# Patient Record
Sex: Female | Born: 1990 | Race: Black or African American | Hispanic: No | Marital: Single | Smoking: Never smoker
Health system: Southern US, Community
[De-identification: ages and names within clinical notes are randomized; demographics above are authoritative.]

## PROBLEM LIST (undated history)

## (undated) HISTORY — PX: MOUTH SURGERY: SHX715

---

## 2009-08-18 ENCOUNTER — Emergency Department (HOSPITAL_COMMUNITY): Admission: EM | Admit: 2009-08-18 | Discharge: 2009-08-18 | Payer: Self-pay | Admitting: Emergency Medicine

## 2009-09-04 ENCOUNTER — Emergency Department (HOSPITAL_COMMUNITY): Admission: EM | Admit: 2009-09-04 | Discharge: 2009-09-04 | Payer: Self-pay | Admitting: Emergency Medicine

## 2010-04-05 LAB — URINALYSIS, ROUTINE W REFLEX MICROSCOPIC
Glucose, UA: NEGATIVE mg/dL
Ketones, ur: NEGATIVE mg/dL
Leukocytes, UA: NEGATIVE
Specific Gravity, Urine: 1.022 (ref 1.005–1.030)
pH: 7 (ref 5.0–8.0)

## 2010-04-05 LAB — URINE MICROSCOPIC-ADD ON

## 2010-04-06 LAB — URINE CULTURE
Colony Count: NO GROWTH
Culture: NO GROWTH

## 2010-04-06 LAB — URINALYSIS, ROUTINE W REFLEX MICROSCOPIC
Bilirubin Urine: NEGATIVE
Glucose, UA: NEGATIVE mg/dL
Hgb urine dipstick: NEGATIVE
Ketones, ur: NEGATIVE mg/dL
Protein, ur: NEGATIVE mg/dL
Urobilinogen, UA: 1 mg/dL (ref 0.0–1.0)

## 2010-04-06 LAB — URINE MICROSCOPIC-ADD ON

## 2010-04-06 LAB — WET PREP, GENITAL

## 2010-04-06 LAB — POCT PREGNANCY, URINE: Preg Test, Ur: NEGATIVE

## 2011-03-30 ENCOUNTER — Emergency Department (INDEPENDENT_AMBULATORY_CARE_PROVIDER_SITE_OTHER)
Admission: EM | Admit: 2011-03-30 | Discharge: 2011-03-30 | Disposition: A | Discharge status: Home / Self Care | Payer: BC Managed Care – PPO | Source: Home / Self Care | Attending: Family Medicine | Admitting: Family Medicine

## 2011-03-30 ENCOUNTER — Encounter (HOSPITAL_COMMUNITY): Payer: Self-pay | Admitting: *Deleted

## 2011-03-30 DIAGNOSIS — K219 Gastro-esophageal reflux disease without esophagitis: Secondary | ICD-10-CM

## 2011-03-30 DIAGNOSIS — F411 Generalized anxiety disorder: Secondary | ICD-10-CM

## 2011-03-30 MED ORDER — GI COCKTAIL ~~LOC~~
30.0000 mL | Freq: Once | ORAL | Status: AC
  Administered 2011-03-30: 30 mL via ORAL

## 2011-03-30 MED ORDER — GI COCKTAIL ~~LOC~~
ORAL | Status: AC
  Filled 2011-03-30: qty 30

## 2011-03-30 MED ORDER — RANITIDINE HCL 150 MG PO TABS: 150.0000 mg | ORAL_TABLET | Freq: Two times a day (BID) | ORAL | Status: DC
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
## 2011-03-30 NOTE — ED Provider Notes (Signed)
History     CSN: [id number]  Arrival date & time 03/30/11  1103   First MD Initiated Contact with Patient 03/30/11 1105      Chief Complaint  Patient presents with  . Chest Pain    (Consider location/radiation/quality/duration/timing/severity/associated sxs/prior treatment) Patient is a 21 y.o. female presenting with chest pain. The history is provided by the patient.  Chest Pain The chest pain began yesterday. Chest pain occurs intermittently. The pain is associated with stress (crying, not sleeping.). The severity of the pain is mild. The quality of the pain is described as burning. The pain does not radiate.     History reviewed. No pertinent past medical history.  History reviewed. No pertinent past surgical history.  Family History  Problem Relation Age of Onset  . Hypertension Mother     History  Substance Use Topics  . Smoking status: Never Smoker   . Smokeless tobacco: Not on file  . Alcohol Use: No    OB History    Grav Para Term Preterm Abortions TAB SAB Ect Mult Living                  Review of Systems  Constitutional: Negative.   HENT: Negative.   Respiratory: Negative.   Cardiovascular: Positive for chest pain.  Psychiatric/Behavioral: Positive for dysphoric mood.    Allergies  Review of patient's allergies indicates no known allergies.  Home Medications  No current outpatient prescriptions on file.  BP 119/74  Pulse 94  Temp(Src) 98.7 F (37.1 C) (Oral)  Resp 14  SpO2 100%  LMP 03/08/2011  Physical Exam  Nursing note and vitals reviewed. Constitutional: She is oriented to person, place, and time. She appears well-developed and well-nourished.  HENT:  Head: Normocephalic.  Eyes: Pupils are equal, round, and reactive to light.  Neck: Normal range of motion. Neck supple.  Cardiovascular: Normal rate, normal heart sounds and intact distal pulses.   Pulmonary/Chest: Effort normal and breath sounds normal. She exhibits tenderness.    Abdominal: Soft. Bowel sounds are normal.  Lymphadenopathy:    She has no cervical adenopathy.  Neurological: She is alert and oriented to person, place, and time.  Skin: Skin is warm and dry.    ED Course  Procedures (including critical care time)  Labs Reviewed - No data to display No results found.   No diagnosis found.    MDM          Linna Hoff, MD 03/30/11 [phone]

## 2011-03-30 NOTE — Discharge Instructions (Signed)
Contact mental health center or your doctor if further problems.Go to www.goodrx.com to look up your medications. This will give you a list of where you can find your prescriptions at the most affordable prices.   RESOURCE GUIDE  Chronic Pain Problems Contact [facility]  [phone] Patients need to be referred by their primary care doctor.  Insufficient Money for Medicine Contact United Way:  call "211" or [facility] 212-828-5091.  No Primary Care Doctor Call [facility]  [phone] Other agencies that provide inexpensive medical care    [facility]  [phone]    [facility]  [phone]    [facility]  [phone]    [facility]  [phone] [address] [place] [address]    [facility]  [phone]    [facility]  [phone] [facility] [phone]   [address] [place] [place], [place] [address]  Psychological Services [facility]  [phone] [facility]  [phone] [facility] Mental Health   [phone] (emergency services [phone])  Substance Abuse Resources Alcohol and Drug Services  [phone] [facility] [phone] [facility] [place] [phone] Floydene Flock [phone] Residential & Outpatient Substance Abuse Program  [phone]  Abuse/Neglect [facility] Child Abuse Hotline (775)427-5078 [facility] Child Abuse Hotline 805-870-5185 (After Hours)  Emergency Shelter [facility] [phone]  Maternity Homes [facility] [place] [facility] ([phone] [facility] [phone]  MRSA Hotline #:   709-785-8931                                                Cristobal Goldmann Phone:  [phone]                                   Phone:  [phone]                 Phone:  [phone]  [facility] Mental Health Phone:   (934)645-2826  [facility] Child Abuse Hotline [phone]    [facility] Resources  [facility] [place]     United Way                          [facility] Dept. [address] [place]                       [address]      [address] [place] [address]   [phone] (After Hours)

## 2011-06-24 ENCOUNTER — Encounter (HOSPITAL_COMMUNITY): Payer: Self-pay

## 2011-06-24 ENCOUNTER — Emergency Department (HOSPITAL_COMMUNITY)
Admission: EM | Admit: 2011-06-24 | Discharge: 2011-06-24 | Disposition: A | Discharge status: Home / Self Care | Payer: BC Managed Care – PPO | Attending: Emergency Medicine | Admitting: Emergency Medicine

## 2011-06-24 ENCOUNTER — Emergency Department (HOSPITAL_COMMUNITY): Payer: BC Managed Care – PPO

## 2011-06-24 DIAGNOSIS — S92403A Displaced unspecified fracture of unspecified great toe, initial encounter for closed fracture: Secondary | ICD-10-CM

## 2011-06-24 DIAGNOSIS — W208XXA Other cause of strike by thrown, projected or falling object, initial encounter: Secondary | ICD-10-CM | POA: Insufficient documentation

## 2011-06-24 DIAGNOSIS — S92919A Unspecified fracture of unspecified toe(s), initial encounter for closed fracture: Secondary | ICD-10-CM | POA: Insufficient documentation

## 2011-06-24 MED ORDER — HYDROCODONE-ACETAMINOPHEN 5-325 MG PO TABS: 2.0000 | ORAL_TABLET | ORAL | Status: AC | PRN

## 2011-06-24 NOTE — Discharge Instructions (Signed)
Do not drive or operate heavy machinery while taking pain medication   RESOURCE GUIDE  Chronic Pain Problems: Contact [facility]  [phone] Patients need to be referred by their primary care doctor.  Insufficient Money for Medicine: Contact United Way:  call "211" or [facility] [phone].  No Primary Care Doctor: - Call [facility]  [phone] - can help you locate a primary care doctor that  accepts your insurance, provides certain services, etc. - Physician Referral Service- [phone]  Agencies that provide inexpensive medical care: - [facility]  [phone] - [facility]  [phone] - [facility]  [phone] - Women's Clinic  [phone] - [facility]  [phone] [facility]  [phone]  Medicaid-accepting [employer] Providers: - [facility]- [address]  [phone], Mon-Fri 9am-7pm, Sat 9am-1pm - [facility]- [address] [place], [address] [place]  [phone] - [facility]- [address], [address] [place]  [phone] [facility]- [address]  [phone] - Renaye Rakers- [address] [place], [address], [phone]  Only accepts [place] patients after they have their name  applied to their card  Self Pay (no insurance) in [place]: - Sickle Cell Patients: Dr Willey Blade, [facility]  [address] [place], [phone] - [facility]- [address] [place]  [phone]       [facility] [place]- [address]       -     [facility]- see information above (Speak to [employer] if you do not have insurance)       -  [facility]- [address] [place], [phone]       -  [facility]- [address],  [phone]       -  [facility]- [address] [phone]       -  Dr Julio Sicks-  [address]  Dr, [address], [place], [phone]       -  [facility] Urgent Care- [address], [phone]       -  [facility]- [address], [phone], also [address] [phone]       -    [facility]- [address] [place], [phone], 1st & 3rd Saturday   every month, 10am-1pm  1) Find a Doctor and Pay Out of Pocket Although you won't have to find out who is covered by your insurance plan, it is a good idea to ask around and get recommendations. You will then need to call the office and see if the doctor you have chosen will accept you as a new patient and what types of options they offer for patients who are self-pay. Some doctors offer discounts or will set up payment plans for their patients who do not have insurance, but you will need to ask so you aren't surprised when you get to your appointment.  2) Contact Your Local Health Department Not all health departments have doctors that can see patients for sick visits, but many do, so it is worth a call to see if yours does. If you don't know where your local health department is, you can check in your phone book. The CDC also has a tool to  help you locate your state's health department, and many state websites also have listings of all of their local health departments.  3) Find a Walk-in Clinic If your illness is not likely to be very severe or complicated, you may want to try a walk in clinic. These are popping up all over the country in pharmacies, drugstores, and shopping centers. They're usually staffed by nurse practitioners or physician assistants that have been trained to treat common illnesses and complaints. They're usually fairly quick and inexpensive. However, if you have serious medical issues or chronic medical problems, these are probably not your best option  STD Testing - [facility] Department of [facility] [place], STD Clinic, [address], [place], phone [phone] or [phone].   Monday - Friday, call for an appointment. [facility] Department of [employer], STD Clinic, [address], [place], phone [phone] or [phone].  Monday - Friday, call for an appointment.  Abuse/Neglect: [facility] Child Abuse Hotline 210-683-2375 [facility] Child Abuse Hotline (437)837-1521 (After Hours)  Emergency Shelter:  [facility] [phone]  Maternity Homes: - [facility] at the [place] [facility] [phone] - [facility] (579) [phone]  MRSA Hotline #:   (510)365-6992  [facility] Resources  [facility] [place]  United Way [facility] Dept. [address]                 [address]         [address] [place] [address]  Blondell Reveal Phone:  [phone]                                  Phone:  [phone]                   Phone:  [phone]  [facility] Mental Health, [phone] - [facility] - [phone]       -     [facility] in [place], [address],                                  6621219382, [facility] Child Abuse Hotline 863-143-2886 or (856)241-7842 (After Hours)   Behavioral Health Services  Substance Abuse Resources: - Alcohol and Drug Services  [phone] - [facility] (954)[phone] - [facility] [place] [phone])[phone] Floydene Flock ([phone]) [phone] - Residential & Outpatient Substance Abuse Program  (581)[phone]  Psychological Services: [facility]  [phone] Services  [phone] - [facility], [phone] [place]. [address] [place], ACCESS LINE: (413)095-1373 or [phone], [email or web address]  Dental Assistance  If unable to pay or uninsured, contact:  [facility] or [facility] to become qualified for the adult dental clinic.  Patients with Medicaid: [facility]  [address], [phone] [address], [phone]  If unable to pay, or uninsured, contact [facility] [phone]) or [facility] Department [phone] in [place], [phone] in [facility]) to become qualified for the adult dental clinic  Other Low-Cost Community Dental Services: - [facility]- [address] [place], [place], [place], [address], [phone], [address]. [phone], 2nd and 4th Thursday of the month at 6:30am.  10 clients each day by appointment, can sometimes see walk-in patients if someone does not show for an appointment. [facility]- [address] [place], [place], [address], [phone] - [facility]- [address] [place], [place], [address], [phone] - [facility]- [phone] [facility] Health Department- [phone] [facility] Department- [phone]
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
## 2011-06-24 NOTE — ED Provider Notes (Signed)
History     CSN: [id number]  Arrival date & time 06/24/11  1641   First MD Initiated Contact with Patient 06/24/11 1738      Chief Complaint  Patient presents with  . Foot Pain    LT side    (Consider location/radiation/quality/duration/timing/severity/associated sxs/prior treatment) HPI Comments: Patient reports that just prior to arrival she dropped a heavy pot on her left great toe.  She has been having pain since that time.  She has not taken anything for pain.  Patient is a 21 y.o. female presenting with lower extremity pain. The history is provided by the patient.  Foot Pain This is a new problem. The current episode started today. The problem occurs constantly. The problem has been unchanged. Pertinent negatives include no chills, fever, nausea, numbness or vomiting. The symptoms are aggravated by walking. She has tried nothing for the symptoms.    No past medical history on file.  Past Surgical History  Procedure Date  . Mouth surgery     teeth extraction.    Family History  Problem Relation Age of Onset  . Hypertension Mother     History  Substance Use Topics  . Smoking status: Never Smoker   . Smokeless tobacco: Not on file  . Alcohol Use: Yes     occasionally     OB History    Grav Para Term Preterm Abortions TAB SAB Ect Mult Living                  Review of Systems  Constitutional: Negative for fever and chills.  Gastrointestinal: Negative for nausea and vomiting.  Musculoskeletal:       Pain with ambulation  Skin: Negative for color change and wound.  Neurological: Negative for numbness.    Allergies  Review of patient's allergies indicates no known allergies.  Home Medications  No current outpatient prescriptions on file.  BP 119/73  Pulse 99  Temp(Src) 98.9 F (37.2 C) (Oral)  Resp 16  Ht 5\' 9"  (1.753 m)  Wt 125 lb (56.7 kg)  BMI 18.46 kg/m2  SpO2 100%  LMP 06/07/2011  Physical Exam  Nursing note and vitals  reviewed. Constitutional: She appears well-developed and well-nourished. No distress.  HENT:  Head: Normocephalic and atraumatic.  Cardiovascular: Normal rate, regular rhythm and normal heart sounds.   Pulses:      Dorsalis pedis pulses are 2+ on the right side, and 2+ on the left side.  Pulmonary/Chest: Effort normal and breath sounds normal.  Musculoskeletal:       Left ankle: She exhibits normal range of motion, no swelling and no deformity. no tenderness.       Mild swelling of the dorsal aspect of the left great toe.  Pain with ROM of left great toe.  Neurological: No sensory deficit.  Skin: Skin is warm, dry and intact. No bruising, no ecchymosis and no laceration noted. She is not diaphoretic. No erythema.  Psychiatric: She has a normal mood and affect.    ED Course  Procedures (including critical care time)  Labs Reviewed - No data to display Dg Foot Complete Left  06/24/2011  *RADIOLOGY REPORT*  Clinical Data: Trauma and pain across the toes.  LEFT FOOT - COMPLETE 3+ VIEW  Comparison: None.  Findings: Three views of the left foot demonstrate a minimally displaced spiral fracture of the great toe proximal phalanx.  The fracture does not clearly involve the articulating surfaces.  No other definite fractures.  IMPRESSION: Minimally displaced  fracture of the great toe proximal phalanx.  Original Report Authenticated By: Richarda Overlie, M.D.     No diagnosis found.    MDM  Patient with fracture of great toe.  Neurovascularly intact.  Patient given Orthopedic boot and toe buddy taped.          Pascal [place], PA-C 06/25/11 0157

## 2011-06-25 NOTE — ED Provider Notes (Signed)
Medical screening examination/treatment/procedure(s) were performed by non-physician practitioner and as supervising physician I was immediately available for consultation/collaboration.    Marishka Rentfrow R Tanav Orsak, MD 06/25/11 1103 

## 2012-06-21 ENCOUNTER — Emergency Department (INDEPENDENT_AMBULATORY_CARE_PROVIDER_SITE_OTHER)
Admission: EM | Admit: 2012-06-21 | Discharge: 2012-06-21 | Disposition: A | Discharge status: Home / Self Care | Payer: BC Managed Care – PPO | Source: Home / Self Care | Attending: Family Medicine | Admitting: Family Medicine

## 2012-06-21 ENCOUNTER — Encounter (HOSPITAL_COMMUNITY): Payer: Self-pay | Admitting: *Deleted

## 2012-06-21 DIAGNOSIS — S0510XA Contusion of eyeball and orbital tissues, unspecified eye, initial encounter: Secondary | ICD-10-CM

## 2012-06-21 DIAGNOSIS — S0511XA Contusion of eyeball and orbital tissues, right eye, initial encounter: Secondary | ICD-10-CM
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
## 2013-12-30 IMAGING — CR DG FOOT COMPLETE 3+V*L*
3 series · 3 of 3 positions shown · non-contrast
Comparison: None.

CLINICAL DATA: Trauma and pain across the toes.

LEFT FOOT - COMPLETE 3+ VIEW

[x foot ap left]
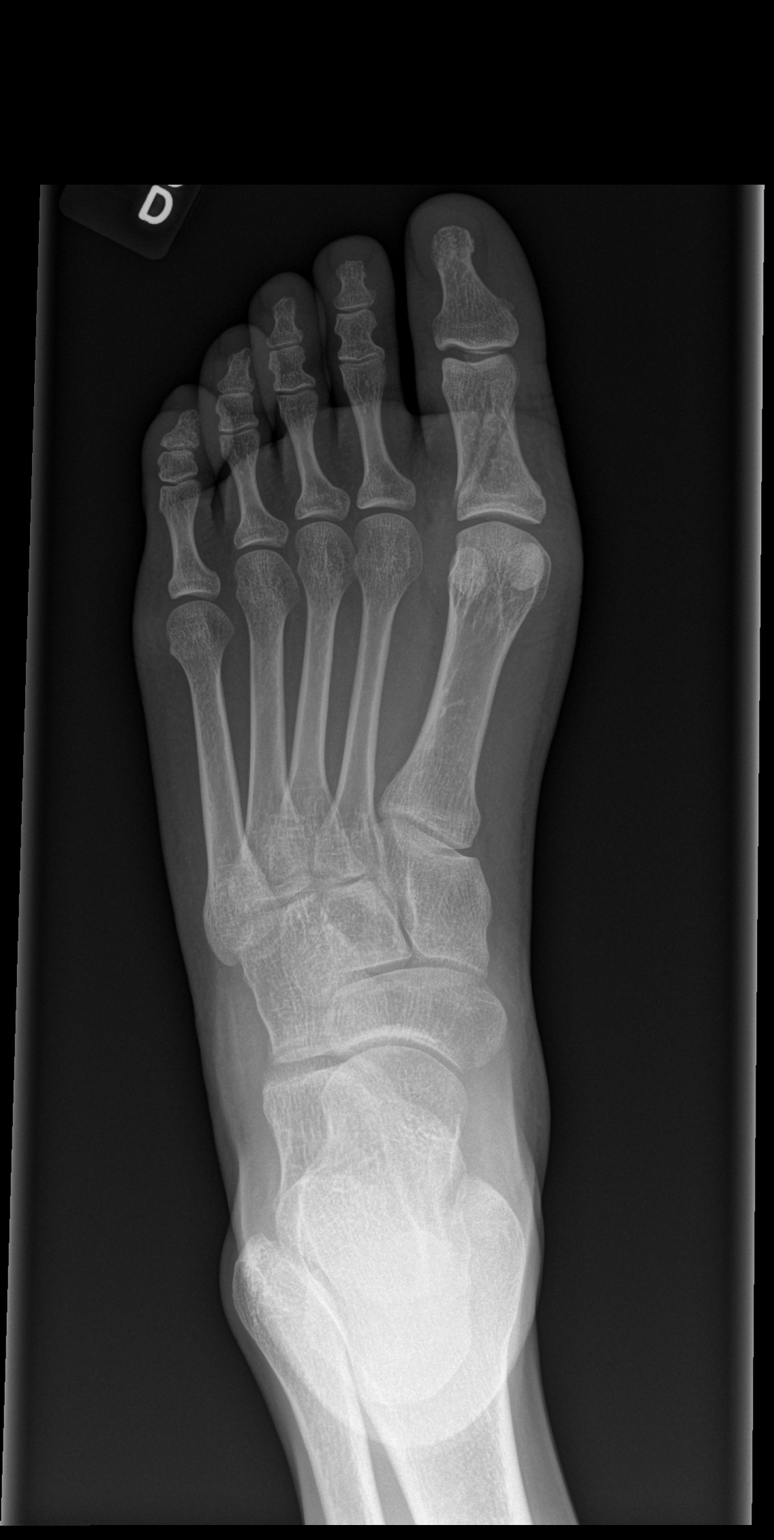

[x foot obl left]
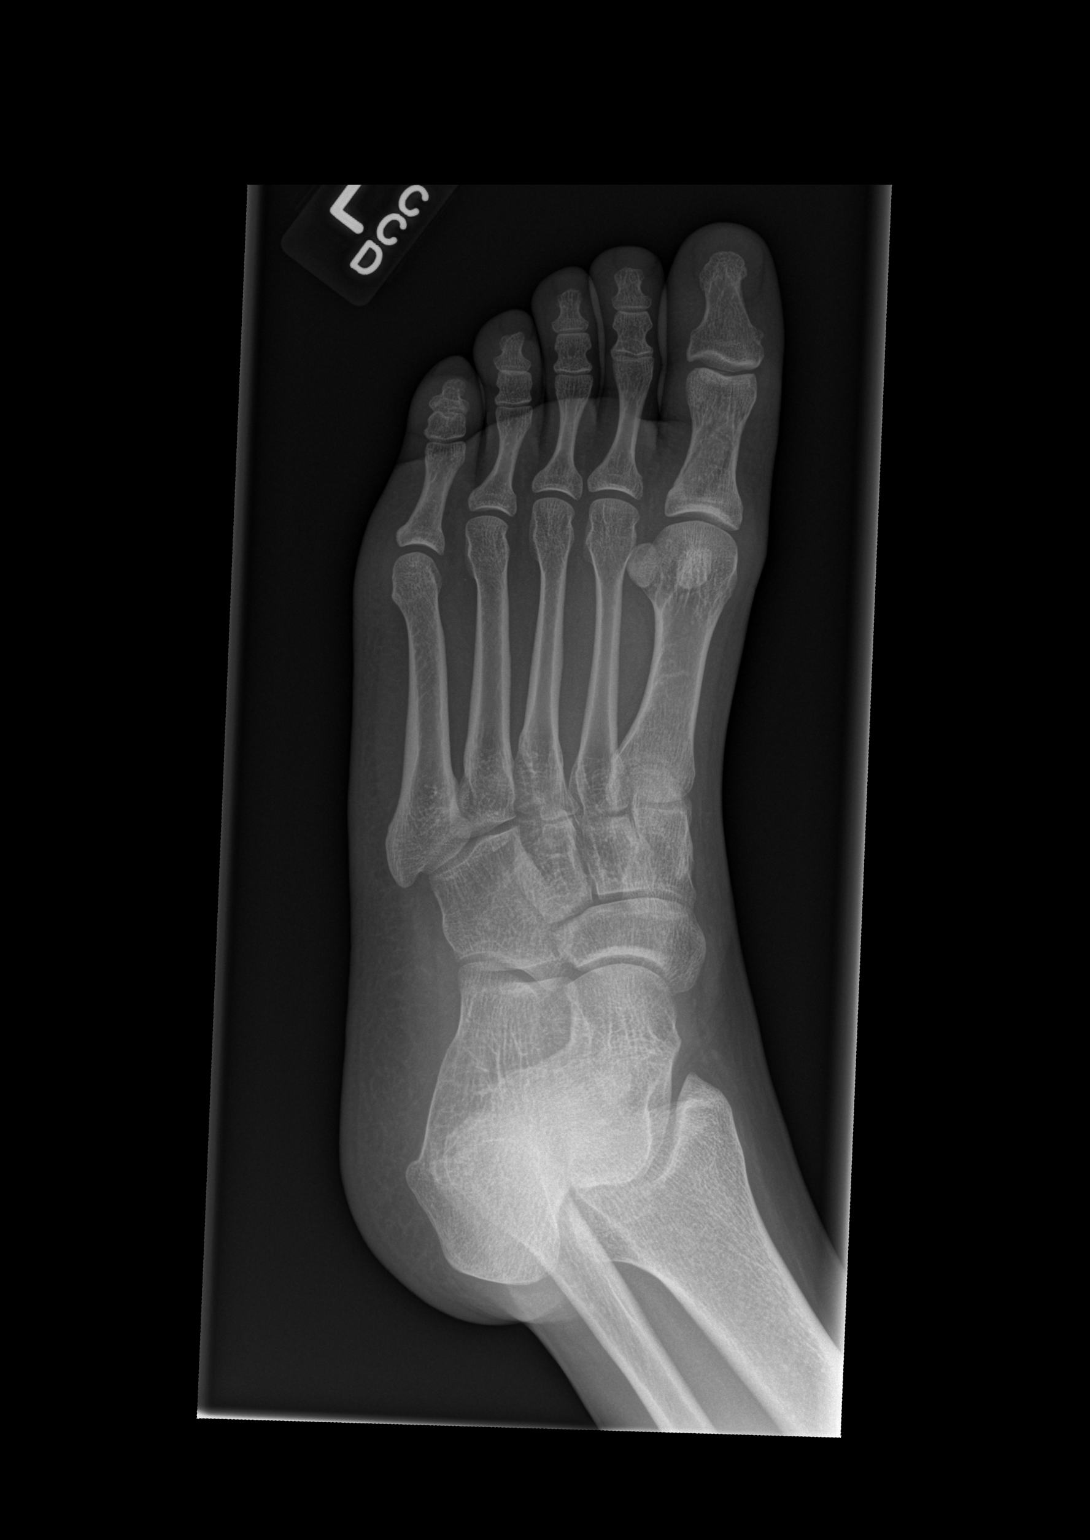

[x foot lat left]
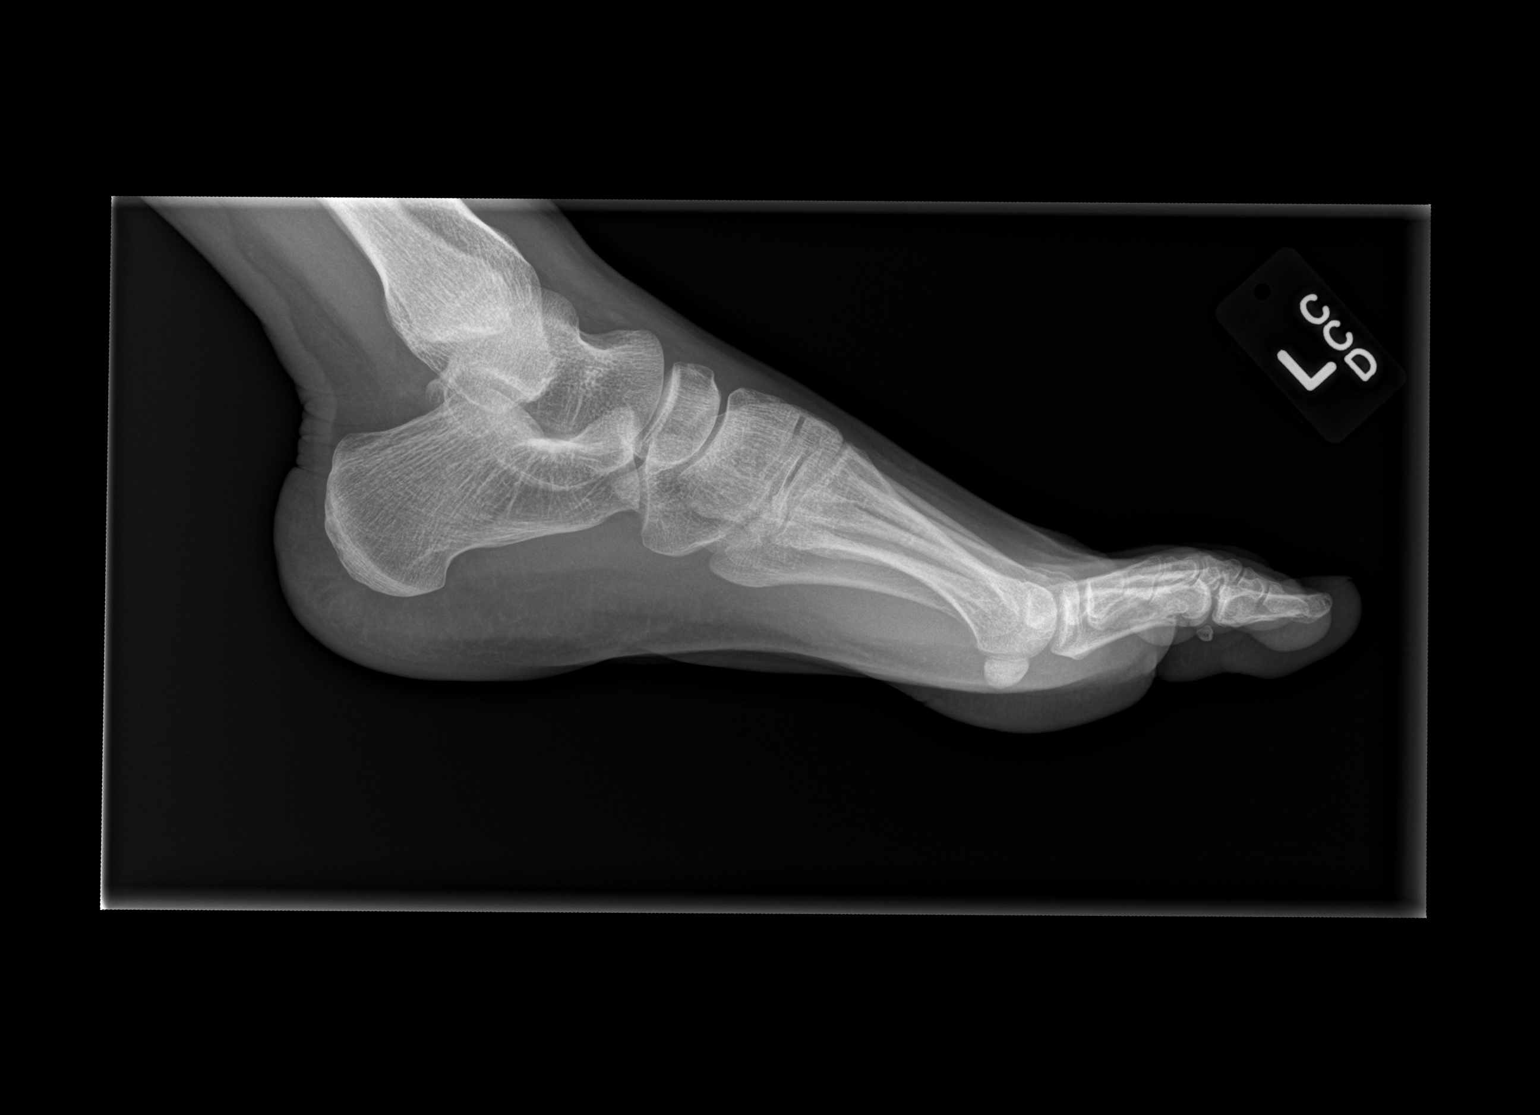

[3 of 3 positions shown; findings below may reference images not displayed]

FINDINGS: Three views of the left foot demonstrate a minimally
displaced spiral fracture of the great toe proximal phalanx.  The
fracture does not clearly involve the articulating surfaces.  No
other definite fractures.
IMPRESSION: Minimally displaced fracture of the great toe proximal phalanx.

## 2015-07-11 ENCOUNTER — Ambulatory Visit (HOSPITAL_COMMUNITY)
Admission: EM | Admit: 2015-07-11 | Discharge: 2015-07-11 | Disposition: A | Discharge status: Home / Self Care | Payer: Managed Care, Other (non HMO) | Attending: Emergency Medicine | Admitting: Emergency Medicine

## 2015-07-11 ENCOUNTER — Encounter (HOSPITAL_COMMUNITY): Payer: Self-pay

## 2015-07-11 DIAGNOSIS — O9989 Other specified diseases and conditions complicating pregnancy, childbirth and the puerperium: Secondary | ICD-10-CM

## 2015-07-11 DIAGNOSIS — R102 Pelvic and perineal pain: Secondary | ICD-10-CM | POA: Diagnosis present

## 2015-07-11 DIAGNOSIS — N760 Acute vaginitis: Secondary | ICD-10-CM | POA: Insufficient documentation

## 2015-07-11 DIAGNOSIS — S76211A Strain of adductor muscle, fascia and tendon of right thigh, initial encounter: Secondary | ICD-10-CM | POA: Diagnosis not present

## 2015-07-11 DIAGNOSIS — N898 Other specified noninflammatory disorders of vagina: Secondary | ICD-10-CM | POA: Diagnosis not present

## 2015-07-11 DIAGNOSIS — N730 Acute parametritis and pelvic cellulitis: Secondary | ICD-10-CM

## 2015-07-11 DIAGNOSIS — N739 Female pelvic inflammatory disease, unspecified: Secondary | ICD-10-CM | POA: Diagnosis not present

## 2015-07-11 DIAGNOSIS — X58XXXA Exposure to other specified factors, initial encounter: Secondary | ICD-10-CM | POA: Diagnosis not present

## 2015-07-11 DIAGNOSIS — O26899 Other specified pregnancy related conditions, unspecified trimester: Secondary | ICD-10-CM

## 2015-07-11 LAB — POCT URINALYSIS DIP (DEVICE)
BILIRUBIN URINE: NEGATIVE
Glucose, UA: NEGATIVE mg/dL
Hgb urine dipstick: NEGATIVE
Ketones, ur: NEGATIVE mg/dL
Leukocytes, UA: NEGATIVE
NITRITE: NEGATIVE
PH: 6 (ref 5.0–8.0)
PROTEIN: 30 mg/dL — AB
Specific Gravity, Urine: >=1.03 (ref 1.005–1.030)
UROBILINOGEN UA: 1 mg/dL (ref 0.0–1.0)

## 2015-07-11 LAB — POCT PREGNANCY, URINE: PREG TEST UR: NEGATIVE

## 2015-07-11 MED ORDER — LIDOCAINE HCL (PF) 1 % IJ SOLN
INTRAMUSCULAR | Status: AC
  Filled 2015-07-11: qty 5

## 2015-07-11 MED ORDER — METRONIDAZOLE 500 MG PO TABS: 500.0000 mg | ORAL_TABLET | Freq: Two times a day (BID) | ORAL | Status: DC

## 2015-07-11 MED ORDER — FLUCONAZOLE 150 MG PO TABS: ORAL_TABLET | ORAL | Status: DC

## 2015-07-11 MED ORDER — CEFTRIAXONE SODIUM 250 MG IJ SOLR
INTRAMUSCULAR | Status: AC
  Filled 2015-07-11: qty 250

## 2015-07-11 MED ORDER — AZITHROMYCIN 250 MG PO TABS
ORAL_TABLET | ORAL | Status: AC
  Filled 2015-07-11: qty 4

## 2015-07-11 MED ORDER — TRAMADOL HCL 50 MG PO TABS: 50.0000 mg | ORAL_TABLET | Freq: Four times a day (QID) | ORAL | Status: AC | PRN

## 2015-07-11 MED ORDER — CEFTRIAXONE SODIUM 250 MG IJ SOLR
250.0000 mg | Freq: Once | INTRAMUSCULAR | Status: AC
  Administered 2015-07-11: 250 mg via INTRAMUSCULAR

## 2015-07-11 MED ORDER — AZITHROMYCIN 250 MG PO TABS
1000.0000 mg | ORAL_TABLET | Freq: Once | ORAL | Status: AC
  Administered 2015-07-11: 1000 mg via ORAL

## 2015-07-11 NOTE — ED Provider Notes (Signed)
CSN: [id number]     Arrival date & time 07/11/15  1943 History   First MD Initiated Contact with Patient 07/11/15 1958     Chief Complaint  Patient presents with  . Pelvic Pain   (Consider location/radiation/quality/duration/timing/severity/associated sxs/prior Treatment) HPI Comments: 25 year old female complaining of right pelvic pain. She states it started approximately one week ago. She describes it as a cramping type pain. She also has a vaginal discharge but states she believes this is normal for her. Today she felt warm and hot and on arrival to the urgent care temperature was 100.4. Denies urinary symptoms.  Points to the proximal medial right thigh as a source of pain and tenderness. She exercises at the gym and this tends to make this worse. Better with rest.   History reviewed. No pertinent past medical history. Past Surgical History  Procedure Laterality Date  . Mouth surgery      teeth extraction.   Family History  Problem Relation Age of Onset  . Hypertension Mother    Social History  Substance Use Topics  . Smoking status: Never Smoker   . Smokeless tobacco: Never Used  . Alcohol Use: Yes     Comment: occasionally    OB History    No data available     Review of Systems  Constitutional: Positive for fever and activity change. Negative for fatigue.  HENT: Negative.   Respiratory: Negative.   Gastrointestinal: Negative.  Negative for nausea, vomiting and abdominal pain.  Genitourinary: Positive for pelvic pain. Negative for dysuria, flank pain, vaginal bleeding, genital sores and menstrual problem.  Musculoskeletal: Negative for back pain, joint swelling and neck pain.  Skin: Negative.  Negative for rash.  Neurological: Negative.   All other systems reviewed and are negative.   Allergies  Review of patient's allergies indicates no known allergies.  Home Medications   Prior to Admission medications   Medication Sig Start Date End Date Taking?  Authorizing Provider  fluconazole (DIFLUCAN) 150 MG tablet 1 tab po x 1. May repeat in 72 hours if no improvement 07/11/15   Hayden Rasmussenavid Brittainy Bucker, NP  metroNIDAZOLE (FLAGYL) 500 MG tablet Take 1 tablet (500 mg total) by mouth 2 (two) times daily. X 7 days 07/11/15   Hayden Rasmussenavid Xavius Spadafore, NP  traMADol (ULTRAM) 50 MG tablet Take 1 tablet (50 mg total) by mouth every 6 (six) hours as needed. 07/11/15   Hayden Rasmussenavid Dallin Mccorkel, NP   Meds Ordered and Administered this Visit   Medications  cefTRIAXone (ROCEPHIN) injection 250 mg (not administered)  azithromycin (ZITHROMAX) tablet 1,000 mg (not administered)    BP 107/71 mmHg  Pulse 109  Temp(Src) 100.4 F (38 C) (Oral)  SpO2 100%  LMP 06/24/2015 (Exact Date) No data found.   Physical Exam  Constitutional: She is oriented to person, place, and time. She appears well-developed and well-nourished. No distress.  Eyes: Conjunctivae and EOM are normal.  Neck: Normal range of motion. Neck supple.  Cardiovascular: Normal rate and regular rhythm.   Pulmonary/Chest: Effort normal.  Abdominal: Soft. Bowel sounds are normal. She exhibits no distension and no mass. There is no tenderness. There is no rebound and no guarding.  Genitourinary:  Normal external female genitalia. There is a moderate amount of thin gray discharge pouring from the vaginal orifice. Upon insertion of the speculum there is a large, copious amount of vaginal discharge. Portion of his thin gray type discharge, there is a thicker white discharge pulled in the vaginal vault and covering the cervix. The ectocervix  is mildly erythematous and there are white dotted specks adhering to the ectocervical surface. Positive CMT and bilateral adnexal tenderness.  Musculoskeletal: She exhibits no edema.  Palpation of the abdomen reveals no abdominal tenderness, rebound or guarding. Palpation of the pelvis reveals no tenderness. Palpation of the adductor muscle of the right leg reproduces the same pain for which patient presents.  Having the patient perform a straight leg raise and then after that the right leg and crossover the left leg reproduces the same pain to a mild degree.  Neurological: She is alert and oriented to person, place, and time. She exhibits normal muscle tone.  Skin: Skin is warm and dry.  Psychiatric: She has a normal mood and affect.  Nursing note and vitals reviewed.   ED Course  Procedures (including critical care time)  Labs Review Labs Reviewed  POCT URINALYSIS DIP (DEVICE) - Abnormal; Notable for the following:    Protein, ur 30 (*)    All other components within normal limits  POCT PREGNANCY, URINE  CERVICOVAGINAL ANCILLARY ONLY   Results for orders placed or performed during the hospital encounter of 07/11/15  POCT urinalysis dip (device)  Result Value Ref Range   Glucose, UA NEGATIVE NEGATIVE mg/dL   Bilirubin Urine NEGATIVE NEGATIVE   Ketones, ur NEGATIVE NEGATIVE mg/dL   Specific Gravity, Urine >=1.030 1.005 - 1.030   Hgb urine dipstick NEGATIVE NEGATIVE   pH 6.0 5.0 - 8.0   Protein, ur 30 (A) NEGATIVE mg/dL   Urobilinogen, UA 1.0 0.0 - 1.0 mg/dL   Nitrite NEGATIVE NEGATIVE   Leukocytes, UA NEGATIVE NEGATIVE  Pregnancy, urine POC  Result Value Ref Range   Preg Test, Ur NEGATIVE NEGATIVE     Imaging Review No results found.   Visual Acuity Review  Right Eye Distance:   Left Eye Distance:   Bilateral Distance:    Right Eye Near:   Left Eye Near:    Bilateral Near:         MDM   1. PID (acute pelvic inflammatory disease)   2. Vaginitis   3. Vaginal discharge   4. Pelvic pain affecting pregnancy   5. Strain of adductor muscle, fascia and tendon of right thigh, initial encounter    Meds ordered this encounter  Medications  . cefTRIAXone (ROCEPHIN) injection 250 mg    Sig:   . azithromycin (ZITHROMAX) tablet 1,000 mg    Sig:   . metroNIDAZOLE (FLAGYL) 500 MG tablet    Sig: Take 1 tablet (500 mg total) by mouth 2 (two) times daily. X 7 days     Dispense:  14 tablet    Refill:  0    Order Specific Question:  Supervising Provider    Answer:  Charm Rings [id number]  . fluconazole (DIFLUCAN) 150 MG tablet    Sig: 1 tab po x 1. May repeat in 72 hours if no improvement    Dispense:  2 tablet    Refill:  0    Order Specific Question:  Supervising Provider    Answer:  Charm Rings [id number]  . traMADol (ULTRAM) 50 MG tablet    Sig: Take 1 tablet (50 mg total) by mouth every 6 (six) hours as needed.    Dispense:  15 tablet    Refill:  0    Order Specific Question:  Supervising Provider    Answer:  Charm Rings [[id number]]   Ibuprofen, heat and stretches for adductor strain Written info on above dx's. Cervical cytology  pending.    Hayden Rasmussen, NP 07/11/15 2040

## 2015-07-11 NOTE — Discharge Instructions (Signed)
Bacterial Vaginosis Bacterial vaginosis is a vaginal infection that occurs when the normal balance of bacteria in the vagina is disrupted. It results from an overgrowth of certain bacteria. This is the most common vaginal infection in women of childbearing age. Treatment is important to prevent complications, especially in pregnant women, as it can cause a premature delivery. CAUSES  Bacterial vaginosis is caused by an increase in harmful bacteria that are normally present in smaller amounts in the vagina. Several different kinds of bacteria can cause bacterial vaginosis. However, the reason that the condition develops is not fully understood. RISK FACTORS Certain activities or behaviors can put you at an increased risk of developing bacterial vaginosis, including:  Having a new sex partner or multiple sex partners.  Douching.  Using an intrauterine device (IUD) for contraception. Women do not get bacterial vaginosis from toilet seats, bedding, swimming pools, or contact with objects around them. SIGNS AND SYMPTOMS  Some women with bacterial vaginosis have no signs or symptoms. Common symptoms include:  Grey vaginal discharge.  A fishlike odor with discharge, especially after sexual intercourse.  Itching or burning of the vagina and vulva.  Burning or pain with urination. DIAGNOSIS  Your health care provider will take a medical history and examine the vagina for signs of bacterial vaginosis. A sample of vaginal fluid may be taken. Your health care provider will look at this sample under a microscope to check for bacteria and abnormal cells. A vaginal pH test may also be done.  TREATMENT  Bacterial vaginosis may be treated with antibiotic medicines. These may be given in the form of a pill or a vaginal cream. A second round of antibiotics may be prescribed if the condition comes back after treatment. Because bacterial vaginosis increases your risk for sexually transmitted diseases, getting  treated can help reduce your risk for chlamydia, gonorrhea, HIV, and herpes. HOME CARE INSTRUCTIONS   Only take over-the-counter or prescription medicines as directed by your health care provider.  If antibiotic medicine was prescribed, take it as directed. Make sure you finish it even if you start to feel better.  Tell all sexual partners that you have a vaginal infection. They should see their health care provider and be treated if they have problems, such as a mild rash or itching.  During treatment, it is important that you follow these instructions:  Avoid sexual activity or use condoms correctly.  Do not douche.  Avoid alcohol as directed by your health care provider.  Avoid breastfeeding as directed by your health care provider. SEEK MEDICAL CARE IF:   Your symptoms are not improving after 3 days of treatment.  You have increased discharge or pain.  You have a fever. MAKE SURE YOU:   Understand these instructions.  Will watch your condition.  Will get help right away if you are not doing well or get worse. FOR MORE INFORMATION  Centers for Disease Control and Prevention, Division of STD Prevention: [employer] American Sexual Health Association (ASHA): www.ashastd.org    This information is not intended to replace advice given to you by your health care provider. Make sure you discuss any questions you have with your health care provider.   Document Released: 01/06/2005 Document Revised: 01/27/2014 Document Reviewed: 08/18/2012 Elsevier Interactive Patient Education 2016 [employer]  Pelvic Inflammatory Disease Pelvic inflammatory disease (PID) is an infection in some or all of the female organs. PID can be in the uterus, ovaries, fallopian tubes, or the surrounding tissues that are inside the lower  belly area (pelvis). PID can lead to lasting problems if it is not treated. To check for this disease, your doctor may:  Do a physical exam.  Do blood tests,  urine tests, or a pregnancy test.  Look at your vaginal discharge.  Do tests to look inside the pelvis.  Test you for other infections. HOME CARE  Take over-the-counter and prescription medicines only as told by your doctor.  If you were prescribed an antibiotic medicine, take it as told by your doctor. Do not stop taking it even if you start to feel better.  Do not have sex until treatment is done or as told by your doctor.  Tell your sex partner if you have PID. Your partner may need to be treated.  Keep all follow-up visits as told by your doctor. This is important.  Your doctor may test you for infection again 3 months after you are treated. GET HELP IF:  You have more fluid (discharge) coming from your vagina or fluid that is not normal.  Your pain does not improve.  You throw up (vomit).  You have a fever.  You cannot take your medicines.  Your partner has a sexually transmitted disease (STD).  You have pain when you pee (urinate). GET HELP RIGHT AWAY IF:  You have more belly (abdominal) or lower belly pain.  You have chills.  You are not better after 72 hours.   This information is not intended to replace advice given to you by your health care provider. Make sure you discuss any questions you have with your health care provider.   Document Released: 04/04/2008 Document Revised: 09/27/2014 Document Reviewed: 02/13/2014 Elsevier Interactive Patient Education 2016 [employer].  Vaginitis Vaginitis is an inflammation of the vagina. It can happen when the normal bacteria and yeast in the vagina grow too much. There are different types. Treatment will depend on the type you have. HOME CARE  Take all medicines as told by your doctor.  Keep your vagina area clean and dry. Avoid soap. Rinse the area with water.  Avoid washing and cleaning out the vagina (douching).  Do not use tampons or have sex (intercourse) until your treatment is done.  Wipe from front  to back after going to the restroom.  Wear cotton underwear.  Avoid wearing underwear while you sleep until your vaginitis is gone.  Avoid tight pants. Avoid underwear or nylons without a cotton panel.  Take off wet clothing (such as a bathing suit) as soon as you can.  Use mild, unscented products. Avoid fabric softeners and scented:  Feminine sprays.  Laundry detergents.  Tampons.  Soaps or bubble baths.  Practice safe sex and use condoms. GET HELP RIGHT AWAY IF:   You have belly (abdominal) pain.  You have a fever or lasting symptoms for more than 2-3 days.  You have a fever and your symptoms suddenly get worse. MAKE SURE YOU:   Understand these instructions.  Will watch this condition.  Will get help right away if you are not doing well or get worse.   This information is not intended to replace advice given to you by your health care provider. Make sure you discuss any questions you have with your health care provider.   Document Released: 04/04/2008 Document Revised: 10/01/2011 Document Reviewed: 06/19/2011 Elsevier Interactive Patient Education 2016 [employer] Inc.  Muscle Strain A muscle strain (pulled muscle) happens when a muscle is stretched beyond normal length. It happens when a sudden, violent force stretches  your muscle too far. Usually, a few of the fibers in your muscle are torn. Muscle strain is common in athletes. Recovery usually takes 1-2 weeks. Complete healing takes 5-6 weeks.  HOME CARE   Follow the PRICE method of treatment to help your injury get better. Do this the first 2-3 days after the injury:  Protect. Protect the muscle to keep it from getting injured again.  Rest. Limit your activity and rest the injured body part.  Ice. Put ice in a plastic bag. Place a towel between your skin and the bag. Then, apply the ice and leave it on from 15-20 minutes each hour. After the third day, switch to moist heat packs.  Compression. Use a splint  or elastic bandage on the injured area for comfort. Do not put it on too tightly.  Elevate. Keep the injured body part above the level of your heart.  Only take medicine as told by your doctor.  Warm up before doing exercise to prevent future muscle strains. GET HELP IF:   You have more pain or puffiness (swelling) in the injured area.  You feel numbness, tingling, or notice a loss of strength in the injured area. MAKE SURE YOU:   Understand these instructions.  Will watch your condition.  Will get help right away if you are not doing well or get worse.   This information is not intended to replace advice given to you by your health care provider. Make sure you discuss any questions you have with your health care provider.   Document Released: 10/16/2007 Document Revised: 10/27/2012 Document Reviewed: 08/05/2012 Elsevier Interactive Patient Education 2016 Else[employer] Inc.  Adductor Muscle Strain With Rehab The adductor muscles of the thigh are responsible for moving the leg across the body and are susceptible to muscle strains. A strain is an injury to a muscle or a tendon that attaches the muscle to a bone. Strains of the adductor muscles occur where the muscle tendons attach to the pelvic bone. A muscle strain may be a complete or partial tear of the muscle and may involve one or more of the adductor muscles. These strains are usually classified as a grade 1 or 2 strain. A grade 1 strain has no obvious sign of tearing or stretching of the muscle or tendon, but may include significant inflammation. A grade 2 strain is a moderate strain in which the muscle or tendon has been partially torn and has been stretched. Grade 2 strains are usually accompanied with loss of strength. A grade 3 muscle strain rarely occurs in the adductor muscles. A grade 3 strain is a complete tear of the muscle or tendon. SYMPTOMS   Occasionally there is a sudden "pop" felt or heard in the groin or inner thigh at  the time of injury.  There may be pain, tenderness, swelling, warmth, or redness over the inner thigh and groin. This may be worsened by moving the hip (especially when spreading the legs or hips, pushing the legs against each other or kicking with the affected leg). There may be bruising (contusion) in the groin and inner thigh within 48 hours following the injury.  There may be loss of fullness of the muscle with complete rupture (uncommon).  Muscle spasm in the groin and inner thigh can occur. CAUSES   Prolonged overuse or a sudden increase in intensity, frequency, or duration of activity.  Single episode of stressful overactivity, such as during kicking.  Single violent blow or force to the inner thigh (  less common). RISK INCREASES WITH:  Sports that require repeated kicking (soccer, martial arts, football), as well as sports that require the legs to be brought together (gymnastics, horseback riding).  Sports that require rapid acceleration (ice hockey, track and field).  Poor strength and flexibility.  Previous thigh injury. PREVENTION   Warm up and stretch properly before activity.  Maintain physical fitness:  Hip and thigh flexibility.  Muscle strength and endurance.  Cardiovascular fitness.  Complete the entire course of rehabilitation after any lower extremity injury. Do this before returning to competition or practice. Follow suggestions of your caregiver. PROGNOSIS  If treated properly, adductor muscle strains usually heal well within 2 to 6 weeks. RELATED COMPLICATIONS   Healing time will be prolonged if the condition is not appropriately treated. It needs adequate time to heal.  Do not return to activity too soon. Recurrence of symptoms and reinjury are possible.  If left untreated, the strain may progress to a complete tear (rare) or other injury caused by limping and favoring the injured leg.  Prolonged disability is possible. TREATMENT Treatment  initially involves ice and medication to help reduce pain and inflammation. Strength and stretching exercises are recommended to maintain strength and a full range of motion. Strenuous activities should be modified to prevent further injury. Using crutches for the first few days may help to lessen pain. On rare occasions, surgery is necessary to reattach the tendon to the bone. If pain becomes persistent or chronic after more than 3 months of nonsurgical treatment, surgery may also be recommended. MEDICATION   If pain medication is necessary, nonsteroidal anti-inflammatory medications, such as aspirin and ibuprofen, or other minor pain relievers, such as acetaminophen, are often recommended.  Do not take pain medication for 7 days before surgery or as advised.  Prescription pain relievers may be given. Use only as directed and only as much as you need.  Ointments applied to the skin may be helpful.  Corticosteroid injections may be given to reduce inflammation. HEAT AND COLD  Cold treatment (icing) relieves pain and reduces inflammation. Cold treatment should be applied for 10 to 15 minutes every 2 to 3 hours for inflammation and pain and immediately after any activity that aggravates your symptoms. Use ice packs or an ice massage.  Heat treatment may be used prior to performing the stretching and strengthening activities prescribed by your caregiver, physical therapist, or athletic trainer. Use a heat pack or a warm soak. SEEK MEDICAL CARE IF:   Symptoms get worse or do not improve in 2 weeks, despite treatment.  New, unexplained symptoms develop. (Drugs used in treatment may produce side effects.) EXERCISES  RANGE OF MOTION (ROM) AND STRETCHING EXERCISES - Adductor Muscle Strain These exercises may help you when beginning to rehabilitate your injury. Your symptoms may resolve with or without further involvement from your physician, physical therapist, or athletic trainer. While completing  these exercises, remember:   Restoring tissue flexibility helps normal motion to return to the joints. This allows healthier, less painful movement and activity.  An effective stretch should be held for at least 30 seconds.  A stretch should never be painful. You should only feel a gentle lengthening or release in the stretched tissue. STRETCH - Adductors, Lunge  While standing, spread your legs.  Lean away from your right / left leg by bending your opposite knee. You may rest your hands on your thigh for balance.  You should feel a stretch in your right / left inner thigh. Hold  for __________ seconds. Repeat __________ times. Complete this exercise __________ times per day.  STRETCH - Adductors, Standing  Place your right / left foot on a counter or stable table. Turn away from your leg so both hips line up with your right / left leg.  Keeping your hips facing forward, slowly bend your opposite leg until you feel a gentle stretch on the inside of your right / left thigh.  Hold for __________ seconds. Repeat __________ times. Complete this exercise __________ times per day.  STRETCH - Hip Adductors, Sitting   Sit on the floor and place the bottoms of your feet together. Keep your chest up and look straight ahead to keep your back in proper alignment. Slide your feet in towards your body as far as you can without rounding your back or increasing any discomfort.  Gently push down on your knees until you feel a gentle stretch in your inner thighs. Hold this position for __________ seconds. Repeat __________ times. Complete this exercise __________ times per day.  STRETCH - Hamstrings/Adductors, V-Sit  Sit on the floor with your legs extended in a large "V," keeping your knees straight.  With your head and chest upright, bend at your waist reaching for your left foot to stretch your right adductors.  You should feel a stretch in your right inner thigh. Hold for __________  seconds.  Return to the upright position to relax your leg muscles.  Continuing to keep your chest upright, bend straight forward at your waist to stretch your hamstrings.  You should feel a stretch behind both of your thighs and/or knees. Hold for __________ seconds.  Return to the upright position to relax your leg muscles.  Repeat steps 2 through 4 for the right leg to stretch your left inner thigh. Repeat __________ times. Complete this exercise __________ times per day.  STRENGTHENING EXERCISES - Adductor Muscle Strain These exercises may help you when beginning to rehabilitate your injury. They may resolve your symptoms with or without further involvement from your physician, physical therapist, or athletic trainer. While completing these exercises, remember:   Muscles can gain both the endurance and the strength needed for everyday activities through controlled exercises.  Complete these exercises as instructed by your physician, physical therapist, or athletic trainer. Progress the resistance and repetitions only as guided.  You may experience muscle soreness or fatigue, but the pain or discomfort you are trying to eliminate should never worsen during these exercises. If this pain does worsen, stop and make certain you are following the directions exactly. If the pain is still present after adjustments, discontinue the exercise until you can discuss the trouble with your caregiver. STRENGTH - Hip Adductors, Isometrics   Sit on a firm chair so that your knees are about the same height as your hips.  Place a large ball, firm pillow, or rolled up bath towel between your thighs.  Squeeze your thighs together, gradually building tension. Hold for __________ seconds.  Release the tension gradually and allow your inner thigh muscles to relax completely before repeating the exercise. Repeat __________ times. Complete this exercise __________ times per day.  STRENGTH - Hip Adductors,  Straight Leg Raises   Lie on your side so that your head, shoulders, knee and hip line up. You may place your upper foot in front to help maintain your balance. Your right / left leg should be on the bottom.  Roll your hips slightly forward, so that your hips are stacked directly over each other  and your right / left knee is facing forward.  Tense the muscles in your inner thigh and lift your bottom leg 4-6 inches. Hold this position for __________ seconds.  Slowly lower your leg to the starting position. Allow the muscles to fully relax before beginning the next repetition. Repeat __________ times. Complete this exercise __________ times per day.    This information is not intended to replace advice given to you by your health care provider. Make sure you discuss any questions you have with your health care provider.   Document Released: 01/06/2005 Document Revised: 01/27/2014 Document Reviewed: 04/20/2008 Elsevier Interactive Patient Education [employer]

## 2015-07-11 NOTE — ED Notes (Signed)
Patient presents with pelvic pain x1 weeks, states she waited to if pain would go away but pain has now increased, no pressure while urinating, normal discharge, no odor to urine. No acute distress

## 2015-07-11 NOTE — ED Notes (Signed)
Urine specimen obtained in lab

## 2015-07-13 LAB — CERVICOVAGINAL ANCILLARY ONLY
CHLAMYDIA, DNA PROBE: NEGATIVE
NEISSERIA GONORRHEA: NEGATIVE
WET PREP (BD AFFIRM): NEGATIVE

## 2015-07-16 ENCOUNTER — Telehealth (HOSPITAL_COMMUNITY): Payer: Self-pay | Admitting: Emergency Medicine

## 2015-07-16 NOTE — ED Notes (Signed)
Called pt and notified of recent lab results from visit 6/21 Pt ID'd properly... Reports feeling better and sx have subsided Adv pt if sx are not getting better to return  Pt verb understanding Education on safe sex given  Per Dr. Dayton ScrapeMurray,   Notes Recorded by Eustace MooreLaura W Murray, MD on 07/14/2015 at 3:12 PM Please let patient know that tests for gonorrhea/chlamydia and for yeast (candida), bacterial vaginosis (gardnerella) and trichomonas were negative. Recheck as needed for persistent symptoms. LM

## 2015-07-26 ENCOUNTER — Ambulatory Visit (HOSPITAL_COMMUNITY)
Admission: EM | Admit: 2015-07-26 | Discharge: 2015-07-26 | Disposition: A | Discharge status: Home / Self Care | Payer: Managed Care, Other (non HMO) | Attending: Internal Medicine | Admitting: Internal Medicine

## 2015-07-26 ENCOUNTER — Encounter (HOSPITAL_COMMUNITY): Payer: Self-pay | Admitting: *Deleted

## 2015-07-26 DIAGNOSIS — R102 Pelvic and perineal pain: Secondary | ICD-10-CM | POA: Insufficient documentation

## 2015-07-26 DIAGNOSIS — R300 Dysuria: Secondary | ICD-10-CM | POA: Insufficient documentation

## 2015-07-26 DIAGNOSIS — N730 Acute parametritis and pelvic cellulitis: Secondary | ICD-10-CM

## 2015-07-26 DIAGNOSIS — N939 Abnormal uterine and vaginal bleeding, unspecified: Secondary | ICD-10-CM | POA: Diagnosis not present

## 2015-07-26 DIAGNOSIS — N739 Female pelvic inflammatory disease, unspecified: Secondary | ICD-10-CM | POA: Diagnosis not present

## 2015-07-26 LAB — HCG, QUANTITATIVE, PREGNANCY: hCG, Beta Chain, Quant, S: <1 m[IU]/mL (ref <5)

## 2015-07-26 MED ORDER — DOXYCYCLINE HYCLATE 100 MG PO CAPS: 100.0000 mg | ORAL_CAPSULE | Freq: Two times a day (BID) | ORAL | Status: AC

## 2015-07-26 MED ORDER — CEFTRIAXONE SODIUM 250 MG IJ SOLR
INTRAMUSCULAR | Status: AC
  Filled 2015-07-26: qty 250

## 2015-07-26 MED ORDER — CEFTRIAXONE SODIUM 250 MG IJ SOLR
250.0000 mg | Freq: Once | INTRAMUSCULAR | Status: AC
  Administered 2015-07-26: 250 mg via INTRAMUSCULAR

## 2015-07-26 NOTE — ED Provider Notes (Signed)
CSN: [id number]     Arrival date & time 07/26/15  1719 History   First MD Initiated Contact with Patient 07/26/15 1834     Chief Complaint  Patient presents with  . Pelvic Pain  . Vaginal Bleeding   HPI  24yo with visit to UC for pelvic discomfort 6/21, tx'ed for PID with rocephin/zithromax/flagyl with improvement in sx's. Had LMP a week early, lighter than expected.  Spotting this week.Home pregnancy tests x 3 negative.  Crampy R>L pelvic discomfort for several days now.  Discharge this am, thick/brownish/clumpy.  No itching/soreness.   Mild dysuria on 1 day.  Normal BMs, normal today.  Emesis x 1 on 7/4, ?related to recent course of flagyl.    History reviewed. No pertinent past medical history. Past Surgical History  Procedure Laterality Date  . Mouth surgery      teeth extraction.   Family History  Problem Relation Age of Onset  . Hypertension Mother    Social History  Substance Use Topics  . Smoking status: Never Smoker   . Smokeless tobacco: Never Used  . Alcohol Use: Yes     Comment: occasionally     Review of Systems  All other systems reviewed and are negative.   Allergies  Review of patient's allergies indicates no known allergies.  Home Medications  Takes none regularly   Meds Ordered and Administered this Visit   Medications  cefTRIAXone (ROCEPHIN) injection 250 mg (250 mg Intramuscular Given 07/26/15 2020)     BP 110/82 mmHg  Pulse 99  Temp(Src) 99.2 F (37.3 C) (Oral)  Resp 12  SpO2 100%  LMP 07/17/2015 Physical Exam  Constitutional: She is oriented to person, place, and time. No distress.  Alert, nicely groomed  HENT:  Head: Atraumatic.  Eyes:  Conjugate gaze, no eye redness/drainage  Neck: Neck supple.  Cardiovascular: Normal rate and regular rhythm.   Pulmonary/Chest: No respiratory distress. She has no wheezes. She has no rales.  Lungs clear, symmetric breath sounds  Abdominal: Soft. She exhibits no distension. There is no tenderness.  There is no rebound and no guarding.  Genitourinary:  Mild cervical motion tenderness, mild R adnexal tenderness/equivocal R adnexal fullness.  L adnexa benign.  Copious thick white discharge.  Cervix/vag mucosa minimally inflamed.  Vulva unremarkable.  Musculoskeletal: Normal range of motion.  No leg swelling  Neurological: She is alert and oriented to person, place, and time.  Skin: Skin is warm and dry.  No cyanosis  Nursing note and vitals reviewed.   ED Course  Procedures (including critical care time)  Labs Review  Results for orders placed or performed during the hospital encounter of 07/26/15  hCG, quantitative, pregnancy  Result Value Ref Range   hCG, Beta Chain, Quant, S <1 <5 mIU/mL  Cervicovaginal ancillary only  Result Value Ref Range   Chlamydia Negative    Neisseria gonorrhea Negative   Cervicovaginal ancillary only  Result Value Ref Range   Wet Prep (BD Affirm) Negative for Candida, Gardnerella, & Trichomonas       MDM   1. Acute PID (pelvic inflammatory disease)    rx doxycycline.  Had recent metronidazole and single dose zithromax with persistent sx's. Followup GYN for further evaluation if symptoms persist.      Eustace MooreLaura W Aadhya Bustamante, MD 08/01/15 1425

## 2015-07-26 NOTE — ED Notes (Signed)
Patient reports lower abdominal pain and vaginal spotting since Sunday. Reports being treated for bacterial vaginosis and yeast infection-took complete prescription. Denies urinary symptoms.

## 2015-07-26 NOTE — Discharge Instructions (Signed)
Pres[facility] [address]. Vaginal tests are pending; we will let you know in the next few days if another antibiotic prescription will be needed.   Injection of rocephin given at urgent care today. Serum pregnancy test was negative.  Pelvic Inflammatory Disease Pelvic inflammatory disease (PID) is an infection in some or all of the female organs. PID can be in the uterus, ovaries, fallopian tubes, or the surrounding tissues that are inside the lower belly area (pelvis). PID can lead to lasting problems if it is not treated. To check for this disease, your doctor may:  Do a physical exam.  Do blood tests, urine tests, or a pregnancy test.  Look at your vaginal discharge.  Do tests to look inside the pelvis.  Test you for other infections. HOME CARE  Take over-the-counter and prescription medicines only as told by your doctor.  If you were prescribed an antibiotic medicine, take it as told by your doctor. Do not stop taking it even if you start to feel better.  Do not have sex until treatment is done or as told by your doctor.  Tell your sex partner if you have PID. Your partner may need to be treated.  Keep all follow-up visits as told by your doctor. This is important.  Your doctor may test you for infection again 3 months after you are treated. GET HELP IF:  You have more fluid (discharge) coming from your vagina or fluid that is not normal.  Your pain does not improve.  You throw up (vomit).  You have a fever.  You cannot take your medicines.  Your partner has a sexually transmitted disease (STD).  You have pain when you pee (urinate). GET HELP RIGHT AWAY IF:  You have more belly (abdominal) or lower belly pain.  You have chills.  You are not better after 72 hours.   This information is not intended to replace advice given to you by your health care provider. Make sure you discuss any questions you have with your health care  provider.   Document Released: 04/04/2008 Document Revised: 09/27/2014 Document Reviewed: 02/13/2014 [employer] Interactive Patient Education [employer]

## 2015-07-27 LAB — CERVICOVAGINAL ANCILLARY ONLY
CHLAMYDIA, DNA PROBE: NEGATIVE
NEISSERIA GONORRHEA: NEGATIVE
Wet Prep (BD Affirm): NEGATIVE
